# Patient Record
Sex: Male | Born: 1935 | Race: Black or African American | Hispanic: No | Marital: Single | Smoking: Never smoker
Health system: Southern US, Community
[De-identification: ages and names within clinical notes are randomized; demographics above are authoritative.]

## PROBLEM LIST (undated history)

## (undated) DIAGNOSIS — M1990 Unspecified osteoarthritis, unspecified site: Secondary | ICD-10-CM

## (undated) DIAGNOSIS — E079 Disorder of thyroid, unspecified: Secondary | ICD-10-CM

## (undated) DIAGNOSIS — I10 Essential (primary) hypertension: Secondary | ICD-10-CM

## (undated) DIAGNOSIS — H269 Unspecified cataract: Secondary | ICD-10-CM

## (undated) DIAGNOSIS — H409 Unspecified glaucoma: Secondary | ICD-10-CM

## (undated) HISTORY — DX: Essential (primary) hypertension: I10

## (undated) HISTORY — DX: Disorder of thyroid, unspecified: E07.9

## (undated) HISTORY — DX: Unspecified osteoarthritis, unspecified site: M19.90

## (undated) HISTORY — DX: Unspecified cataract: H26.9

## (undated) HISTORY — PX: EYE SURGERY: SHX253

## (undated) HISTORY — DX: Unspecified glaucoma: H40.9

## (undated) HISTORY — PX: CARDIAC VALVE REPLACEMENT: SHX585

---

## 1999-05-09 ENCOUNTER — Inpatient Hospital Stay: Admission: EM | Admit: 1999-05-09 | Discharge: 1999-05-10 | Payer: Self-pay | Admitting: *Deleted

## 2000-02-03 ENCOUNTER — Ambulatory Visit (HOSPITAL_COMMUNITY): Admission: RE | Admit: 2000-02-03 | Discharge: 2000-02-03 | Payer: Self-pay | Admitting: Cardiology

## 2000-02-05 ENCOUNTER — Ambulatory Visit (HOSPITAL_COMMUNITY): Admission: RE | Admit: 2000-02-05 | Discharge: 2000-02-05 | Payer: Self-pay | Admitting: Cardiology

## 2000-02-25 ENCOUNTER — Encounter: Payer: Self-pay | Admitting: Cardiothoracic Surgery

## 2000-03-01 ENCOUNTER — Encounter: Payer: Self-pay | Admitting: Cardiothoracic Surgery

## 2000-03-01 ENCOUNTER — Encounter (INDEPENDENT_AMBULATORY_CARE_PROVIDER_SITE_OTHER): Payer: Self-pay | Admitting: Specialist

## 2000-03-01 ENCOUNTER — Inpatient Hospital Stay (HOSPITAL_COMMUNITY): Admission: RE | Admit: 2000-03-01 | Discharge: 2000-03-07 | Payer: Self-pay | Admitting: Cardiothoracic Surgery

## 2000-03-02 ENCOUNTER — Encounter: Payer: Self-pay | Admitting: Cardiothoracic Surgery

## 2000-03-03 ENCOUNTER — Encounter: Payer: Self-pay | Admitting: Cardiothoracic Surgery

## 2000-03-04 ENCOUNTER — Encounter: Payer: Self-pay | Admitting: Cardiothoracic Surgery

## 2000-03-16 ENCOUNTER — Encounter: Admission: RE | Admit: 2000-03-16 | Discharge: 2000-03-16 | Payer: Self-pay | Admitting: Family Medicine

## 2000-08-01 ENCOUNTER — Encounter: Payer: Self-pay | Admitting: Internal Medicine

## 2000-08-01 ENCOUNTER — Encounter: Admission: RE | Admit: 2000-08-01 | Discharge: 2000-08-01 | Payer: Self-pay | Admitting: Internal Medicine

## 2000-11-23 ENCOUNTER — Emergency Department (HOSPITAL_COMMUNITY): Admission: EM | Admit: 2000-11-23 | Discharge: 2000-11-23 | Payer: Self-pay | Admitting: Emergency Medicine

## 2002-04-04 ENCOUNTER — Encounter: Payer: Self-pay | Admitting: Emergency Medicine

## 2002-04-04 ENCOUNTER — Emergency Department (HOSPITAL_COMMUNITY): Admission: EM | Admit: 2002-04-04 | Discharge: 2002-04-04 | Payer: Self-pay | Admitting: Emergency Medicine

## 2007-04-19 ENCOUNTER — Ambulatory Visit: Payer: Self-pay | Admitting: Vascular Surgery

## 2008-04-23 ENCOUNTER — Ambulatory Visit (HOSPITAL_COMMUNITY): Admission: RE | Admit: 2008-04-23 | Discharge: 2008-04-24 | Payer: Self-pay | Admitting: General Surgery

## 2010-09-29 NOTE — Op Note (Signed)
Lee Lyons, Lee Lyons                 ACCOUNT NO.:  [id number]   MEDICAL RECORD NO.:  [id number]          PATIENT TYPE:  AMB   LOCATION:  DAY                          FACILITY:  [facility]   PHYSICIAN:  Sharlet Salina T. Hoxworth, M.D.DATE OF BIRTH:  08-06-1935   DATE OF PROCEDURE:  04/23/2008  DATE OF DISCHARGE:                               OPERATIVE REPORT   PREOPERATIVE DIAGNOSIS:  Recurrent left inguinal hernia.   POSTOPERATIVE DIAGNOSIS:  Recurrent left inguinal hernia.   SURGICAL PROCEDURE:  Laparoscopic repair, recurrent left inguinal  hernia.   ANESTHESIA:  General.   BRIEF HISTORY:  Mr. Skelley is a 75 year old male with a remote history of  a left inguinal hernia repair.  He now presents with an increasingly  uncomfortable bulge in the left groin and has a recurrent hernia  confirmed on exam.  I have discussed options for treatment and after  discussion detailed elsewhere, we have elected to proceed with  laparoscopic repair of his hernia.  The nature of the procedure,  expected recovery, risks of open procedure, bladder or intestinal  injury, infection, bleeding, cardiorespiratory complications, and  recurrence were discussed and understood.  The patient is now brought to  the operating room for this procedure.   DESCRIPTION OF OPERATION:  The patient was brought to the operating  room, placed in the supine position on the operating room table, and  general endotracheal anesthesia was induced.  A Foley catheter was  placed.  The abdomen was widely sterilely prepped and draped.  PAS were  in place.  He received preoperative IV antibiotics.  The correct patient  and procedure were verified.  Local anesthesia was used infiltrate the  trocar sites.  A 1-cm incision was made at the umbilicus and dissection  carried down to the anterior fascia.  This was then sharply incised  transversely for 1 cm and the medial edge of the left rectus muscle  retracted laterally and the preperitoneal  space entered under direct  vision.  The tip of the balloon dissector was passed into this space and  then along the midline to the pubic symphysis.  Under laparoscopic  vision, the balloon was then inflated with good bilateral deployment.  It was left in place for several minutes for hemostasis then removed and  the balloon trocar inflated and CO2 pressure applied.  Laparoscopy  showed good dissection of the preperitoneal space.  The pubic symphysis  was identified and cleared, and the Cooper's ligament was cleared down  toward the iliac vessels which were identified and carefully protected.  The epigastric vessels were identified.  The peritoneum lateral to this  had been dissected well posteriorly, but it was further dissected and we  worked out laterally to the anterior-superior iliac spine.  The  peritoneum was dissected posteriorly out to the level of the umbilicus.  I then followed the peritoneal edge back medially and there was a good-  sized indirect sac.  The sac was carefully dissected away from cord  structures with blunt dissection, the cord structures isolated and  protected.  The sac was then reduced from  the internal ring and the  peritoneum stripped back completely posteriorly.  After thorough  dissection, a Bard left-sided large 3-D max mesh was introduced to the  preperitoneal space and oriented and positioned.  It was tacked  initially to the pubic symphysis midline.  The mesh was then deployed  well laterally and beginning along the superior edge laterally, being  able to feel the tip of the tacker through the abdominal wall.  The  superior edge of the mesh was tacked back, working medially, avoiding  the epigastric vessels, and then finally the medial edge of the mesh was  tacked and a couple of more tacks placed in Cooper's ligament.  The  hernia sac was brought deep to the mesh and then tacked again feeling  the tip of the tacker through the anterior abdominal wall  so that the  sac was held deep to the mesh.  A small opening had been created in the  peritoneum near the base of the sac and there was moderate  pneumoperitoneum.  The mesh appeared to be in excellent position.  There  was no bleeding.  No evidence of visceral injury.  CO2 was evacuated and  trocars were  removed.  A Veress needle in the right upper quadrant was then used to  fully evacuate the pneumoperitoneum.  Skin incisions were then closed  with interrupted Monocryl sutures and Dermabond.  Sponge, needle, and  instrument counts were correct.  The patient was taken to recovery in  good condition.      Lorne Skeens. Hoxworth, M.D.  Electronically Signed     BTH/MEDQ  D:  04/23/2008  T:  04/23/2008  Job:  [id number]

## 2010-09-29 NOTE — Procedures (Signed)
CAROTID DUPLEX EXAM   INDICATION:  Left eye visual disturbance.   HISTORY:  Diabetes:  No.  Cardiac:  CABG in 2000.  Hypertension:  Yes.  Smoking:  No. Quit 25 years ago.  Previous Surgery:  No.  CV History:  The patient complained of feeling like there was a covering  over his left eye.  This has since resolved.  Amaurosis Fugax Yes , Paresthesias No, Hemiparesis No.                                       RIGHT             LEFT  Brachial systolic pressure:         120               120  Brachial Doppler waveforms:         Biphasic          Biphasic  Vertebral direction of flow:        Antegrade         Antegrade  DUPLEX VELOCITIES (cm/sec)  CCA peak systolic                   84                64  ECA peak systolic                   54                45  ICA peak systolic                   80                48  ICA end diastolic                   29                19  PLAQUE MORPHOLOGY:                  Mixed             None  PLAQUE AMOUNT:                      Mild              None  PLAQUE LOCATION:                    Proximal ICA      None   IMPRESSION:  1. 20-39% right ICA stenosis.  2. No left ICA stenosis.  3. A preliminary copy was faxed to Dr. Ronnald Nian office.   ___________________________________________  Larina Earthly, M.D.   DP/MEDQ  D:  04/19/2007  T:  04/20/2007  Job:  [id number]

## 2010-10-02 NOTE — Op Note (Signed)
[place]. [facility]  Patient:    CATALINO, PLASCENCIA                        MRN: [id number] Proc. Date: 02/29/00 Adm. Date:  [id number] Attending:  Waldo Laine CC:         Colleen Can. Deborah Chalk, M.D.                           Operative Report  PREOPERATIVE DIAGNOSIS:  Severe aortic insufficiency.  POSTOPERATIVE DIAGNOSIS:  Severe aortic insufficiency.  SURGICAL PROCEDURE:  Aortic valve replacement with a #23 pericardial tissue valve.  SURGEON:  Gwenith Daily. Tyrone Sage, M.D.  FIRST ASSISTANT:  Nicholes Calamity, P.A.  BRIEF HISTORY:  The patient is a 75 year old male who has severe aortic insufficiency and developed increasing symptoms of congestive heart failure. He was originally seen in November of 2000, and at that time with severe aortic insufficiency and symptoms of congestive heart failure.  Aortic valve replacement was recommended.  The patient decided he would seek care at the [facility] which he did and evidently was told he did not need surgery.  He was lost to follow up when he represented recently with increasing symptoms of heart failure and was seen by Dr. Loleta Books for further evaluation.  Echocardiogram and cardiac catheterization confirmed severe aortic insufficiency with severe left ventricular dysfunction, though the patient seemed to have a better functional class in the degree of LV dysfunction it was present on ventriculogram and echocardiogram.  This aortic valve replacement was recommended to the patient and in counseling it was decided that long term Coumadin therapy would be best avoided.  The patient agreed and signed informed consent.  DESCRIPTION OF PROCEDURE:  With Theone Murdoch and arterial line monitor placed the patient underwent general endotracheal anesthesia.  He was noted to have a placement of a Swan Ganz catheter and to have significant pulmonary hypertension and this was also noted on his cardiac catheterization.   A transesophageal echocardiogram probe was placed and confirmed severe aortic insufficiency.  The skin of the chest and legs was prepped with Betadine and draped in the usual sterile manner.  A median sternotomy was performed. Pericardium was opened.  The patient had marked enlargement of the right side of the heart and right atrium.  He was in a sinus rhythm.  He was systemically heparinized.  The ascending aorta and the right atrium were cannulated.  In the aortic root then cardioplegia needle was introduced into the ascending aorta.  The patient was placed on cardiopulmonary bypass 2.4 liters per minute per m sq.  The right superior pulmonary vein ______ was placed.  The  The patients body temperature was cooled to 28 degrees.  The aortic crossclamp was applied and 500 cc of transverse aortotomy was created. Cardioplegia was administered directly into the coronary ostium.  With diastolic arrest of the heart, myocardial septal temperature was monitored throughout the crossclamp.  On examination of the valve the patient had a tricuspid valve with significantly rolled edges of the valve and prolapsing of the right coronary cusp.  The valve was excised.  A annulus was of a size to admit a 23 pericardial tissue valve which was selected and secured in place with interrupted #2 Tycron pledget of sutures.  With the valve well seated there was no impingement of the right or left coronary ostium.  The aorta was closed with  a horizontal mattress 3-0 Prolene suture.  The head was put in a down position.  The heart was allowed to passively fill and deair.  The aortic crossclamp was removed and the total crossclamp time was 66 minutes.  The patients body temperature rewarmed to 37 degrees.  He required electrical defibrillation and returned to a sinus rhythm and was transiently atrially paced to increase the rate.  A 16 gauge needle was introduced into the left ventricular apex to further deair the  heart.  With the patients body temperature rewarmed to 37 degrees she was started on melanin and dopamine infusions and was ventilated and weaned from cardiopulmonary bypass and remaining hemodynamically stable, and was decannulated in the usual fashion.  Protamine and sulfate was administered. With the operative field hemostatic, two atrial and two ventricular pacing wires were applied.  Graft markers applied.  A left pleural tube and two mediastinal tubes left in place.   The sternum was closed with #6 stainless steel wire.  Fascia was closed with interrupted 0 Vicryl, running 3-0 Vicryl and subcutaneous tissue, 4-0 subcuticular stitch in the skin edges. Dry dressings were applied.  Sponge and needle counts were reported as correct at the completion of the procedure.  The patient tolerated the procedure without obvious complication and was transferred to the surgical intensive care unit for further postoperative care. DD:  03/02/00 TD:  03/02/00 Job: [id number]

## 2010-10-02 NOTE — Cardiovascular Report (Signed)
[place]  Patient:    Lee Lyons, Lee Lyons                        MRN: [id number] Proc. Date: 02/05/00 Adm. Date:  [id number] Attending:  Eleanora Neighbor CC:         Antony Madura, M.D.   Cardiac Catheterization  INDICATIONS:  Lee Lyons has known severe aortic insufficiency and presented with pulmonary edema in December of 2000.  He elected to proceed to the [facility] and then proceeded with the appropriate medical care.  He presents now with increasing dyspnea and edema.  He has improved on diuretics.  PROCEDURE:  Right and left heart catheterization with selective coronary angiography, left ventricular angiography, and aortic root angiography.  TYPE AND SITE OF ENTRY:  Right femoral artery, percutaneous right femoral vein, attempted Perclose which was unsuccessful.  MEDICATIONS GIVEN PRIOR TO THE PROCEDURE:  Valium 10 mg p.o.  MEDICATIONS GIVEN DURING THE PROCEDURE:  Versed 2 mg IV.  CATHETERS:  A 6 French 4 curved Judkins right and left coronary catheters, 6 French pigtail ventriculographic catheter, 7 French Swan-Ganz thermodilution catheter.  CONTRAST MATERIAL:  Omnipaque.  COMMENTS:  The patient tolerated the procedure well.  HEMODYNAMIC DATA:  The right atrial pressure A wave of 15, V wave of 15, mean of 13, RV was 59/21.  Pulmonary capillary wedge pressure showed A wave 28, V wave 28, mean of 25.  Pulmonary artery pressure is 56/31.  Cardiac output was 1.6, cardiac index was 1.0 by thermodilution by Fick.  Karo was 2.8 with an cardiac index of 1.8.  The LV pressure is 115/25, the aortic pressure was 115/66.  There was no aortic valve gradient noted.  The aortic valve area was calculated and was basically within normal limits.  ANGIOGRAPHIC DATA:  LEFT VENTRICULOGRAPHY:  The left ventricular angiogram was performed in the RAO position.  There as global hypokinesis and the global ejection fraction was 30%.  There was 1-2+  mitral regurgitation noted.  There was no intracavitary filling defects or intracardiac calcification.  AORTIC ROOT:  The aortic root was performed using 40 cc of contrast at 20 cc/sec.  There was 4+ aortic insufficiency with the left ventricle becoming more opacified than aortic root as the cycles continued.  CORONARY ARTERIES: 1. Left main coronary artery:  Normal. 2. Left circumflex:  The left circumflex has a 90% small obtuse marginal that    continues on the posterolateral branch.  The first obtuse marginal has    irregularities but is otherwise without significant focal disease. 3. Left anterior descending:  The left anterior descending has irregularities.    There is a large proximal diagonal.  It is free of disease otherwise. 4. Right coronary artery:  The right coronary artery is a large dominant    vessel.  It has irregularities but no significant focal disease.  OVERALL IMPRESSION: 1. Left ventricular dysfunction. 2. Severe aortic insufficiency. 3. Mild mitral regurgitation. 4. Single-vessel coronary disease.  DISCUSSION:  Unfortunately it would appear that Lee Lyons is beyond the point that we can proceed on with repair of his aortic insufficiency.  We will ask the Cardiovascular Surgeons to evaluate him for possible surgery, but it would be my impression that he unfortunately is not a good candidate at this point. D:  02/05/00 TD:  02/05/00 Job: [id number]

## 2010-10-02 NOTE — Discharge Summary (Signed)
[place]. [facility]  Patient:    Lee Lyons, Lee Lyons                        MRN: [id number] Adm. Date:  [id number] Disc. Date: [id number] Attending:  Waldo Laine Dictator:   Lissa Hoard, P.A.-C.                           Discharge Summary  DATE OF BIRTH:  26-Aug-1935  ADMITTING DIAGNOSES:  Aortic insufficiency.  DISCHARGE DIAGNOSES:  Status post aortic valve repair.  ADMITTING HISTORY AND PHYSICAL:  This 75 year old African-American male was referred to Dr. Tyrone Sage for evaluation of aortic stenosis.  The patient had recently presented to the hospital with pulmonary edema and significant shortness of breath and nonspecific anginal symptoms.  A 2-D echocardiogram done at that time showed left ventricular hypertrophy with decreased left ventricular function.  There was also severe aortic insufficiency and severe mitral regurgitation noted.  Because of these symptoms, Dr. Tyrone Sage recommended aortic valve replacement.  PHYSICAL EXAMINATION:  VITAL SIGNS:  Blood pressure 120/80, pulse 88 and regular, respirations 20.  HEENT:  Within normal limits with the exception of bilateral glaucoma.  NECK:  Supple without JVD, carotid bruit, thyromegaly, or lymphadenopathy.  LUNGS:  Clear to auscultation bilaterally.  CARDIOVASCULAR:  Regular rate and rhythm with a 2/6 systolic murmur extending to the axilla.  ABDOMEN:  Soft, nontender, positive bowel sounds in all four quadrants.  EXTREMITIES:  Revealed no cyanosis, clubbing; however, there was trace edema bilaterally.  NEUROLOGIC:  Grossly intact.  HOSPITAL COURSE:  March 01, 2000, the patient underwent an aortic valve replacement with a 23 mm pericardial valve.  The procedure was performed by Dr. Tyrone Sage under general endotracheal anesthesia and cardiopulmonary bypass. No complications were noted during the procedure; however, the patient was started on milrinone intraoperatively, and the  patient was transferred to SICU in stable condition.  Postoperatively he was found to a cardiac index of 1.5 and hematocrit 46.  He was extubated later on the operative day.  On postoperative day one, the patient was found to be in normal sinus rhythm which he remained throughout the remainder of his hospital stay.  He was weaned off milrinone and found to have a hematocrit of 37.8.  His mediastinum tube was discontinued on this date.  His creatinine was 1.2 at this time which remained stable throughout the remainder of his hospital stay.  ______ 3.7 which was repleted.  At this time, he had a cardiac index of 2.2 and 2.3. Postoperative day #2, the patient was transferred out of the SICU, had a hematocrit of 31.6 and remained in normal sinus rhythm.  He was started on Coumadin and later found to have an INR of 1.7.  By postoperative day #3, the patients INR had fallen to 1.3.  Hematocrit had fallen slightly to 27.2.  He began ambulation which he tolerated well.  He remained in normal sinus rhythm on postoperative day #4, and he was continued on Coumadin.  Postoperative day #5, he again remained stable; however, he had a short run of supraventricular tachycardia with no sequelae.  His INR was 1.5 at this time.  By postoperative day #6, the patients INR had risen to 1.9., and he was stable tolerating p.o. intake and ambulating well.  Discharge planning was made for postoperative day #7, March 08, 2000.  DISCHARGE MEDICATIONS: 1. Darvocet-N 100, 1-2 tablets  every 4-6 hours as needed for pain. 2. Coumadin 1 tablet daily or as directed by INR. 3. Lopressor 50 mg 1/2 tablet every 12 hours. 4. Imdur 30 mg 1 tablet daily. 5. The patient was also instructed to resume his home medications.  DISCHARGE ACTIVITY:  The patient is told to avoid driving or lifting objects over 10 lbs. and other strenuous activity.  DISCHARGE DIET:  Low fat, low salt.  DISCHARGE WOUND CARE:  The patient was told  that he may shower and clean his incisions with mild soap and water.  DISCHARGE DISPOSITION:  To home.  DISCHARGE FOLLOW-UP:  The patient was instructed to call his cardiologist to schedule an appointment for two weeks.  He was also instructed to have his INR checked in two days which will further dictate his Coumadin therapy.  The patient was also instructed to bring x-ray taken by his cardiologist to Dr. Macky Lower office on follow-up, and he is told that Dr. Macky Lower office will call him for an appointment in three weeks. DD:  04/04/00 TD:  04/05/00 Job: [id number]

## 2011-02-19 LAB — CBC
MCV: 98.1 fL (ref 78.0–100.0)
Platelets: 142 10*3/uL — ABNORMAL LOW (ref 150–400)
RBC: 4.02 MIL/uL — ABNORMAL LOW (ref 4.22–5.81)
WBC: 3.5 10*3/uL — ABNORMAL LOW (ref 4.0–10.5)

## 2011-02-19 LAB — COMPREHENSIVE METABOLIC PANEL
ALT: 31 U/L (ref 0–53)
AST: 31 U/L (ref 0–37)
Albumin: 3.8 g/dL (ref 3.5–5.2)
Calcium: 9.8 mg/dL (ref 8.4–10.5)
Chloride: 105 mEq/L (ref 96–112)
Creatinine, Ser: 1.22 mg/dL (ref 0.4–1.5)
GFR calc Af Amer: >60 mL/min (ref >60)
Sodium: 139 mEq/L (ref 135–145)

## 2011-02-19 LAB — URINALYSIS, ROUTINE W REFLEX MICROSCOPIC
Glucose, UA: NEGATIVE mg/dL
Hgb urine dipstick: NEGATIVE
Protein, ur: NEGATIVE mg/dL
Specific Gravity, Urine: 1.015 (ref 1.005–1.030)
pH: 5.5 (ref 5.0–8.0)

## 2011-02-19 LAB — DIFFERENTIAL
Eosinophils Absolute: 0.1 10*3/uL (ref 0.0–0.7)
Eosinophils Relative: 2 % (ref 0–5)
Lymphocytes Relative: 30 % (ref 12–46)
Lymphs Abs: 1 10*3/uL (ref 0.7–4.0)
Monocytes Absolute: 0.4 10*3/uL (ref 0.1–1.0)

## 2011-02-19 LAB — URINE MICROSCOPIC-ADD ON

## 2013-08-10 ENCOUNTER — Ambulatory Visit (INDEPENDENT_AMBULATORY_CARE_PROVIDER_SITE_OTHER): Payer: Medicare Other | Admitting: Emergency Medicine

## 2013-08-10 ENCOUNTER — Ambulatory Visit: Payer: Medicare Other

## 2013-08-10 VITALS — BP 110/70 | HR 56 | Temp 97.8°F | Resp 16 | Ht 63.5 in | Wt 118.0 lb

## 2013-08-10 DIAGNOSIS — R3589 Other polyuria: Secondary | ICD-10-CM

## 2013-08-10 DIAGNOSIS — S20229A Contusion of unspecified back wall of thorax, initial encounter: Secondary | ICD-10-CM

## 2013-08-10 DIAGNOSIS — R358 Other polyuria: Secondary | ICD-10-CM

## 2013-08-10 DIAGNOSIS — S9000XA Contusion of unspecified ankle, initial encounter: Secondary | ICD-10-CM

## 2013-08-10 DIAGNOSIS — S60229A Contusion of unspecified hand, initial encounter: Secondary | ICD-10-CM

## 2013-08-10 DIAGNOSIS — M549 Dorsalgia, unspecified: Secondary | ICD-10-CM

## 2013-08-10 DIAGNOSIS — M79609 Pain in unspecified limb: Secondary | ICD-10-CM

## 2013-08-10 DIAGNOSIS — S300XXA Contusion of lower back and pelvis, initial encounter: Secondary | ICD-10-CM

## 2013-08-10 LAB — GLUCOSE, POCT (MANUAL RESULT ENTRY): POC GLUCOSE: 145 mg/dL — AB (ref 70–99)

## 2013-08-10 MED ORDER — MELOXICAM 15 MG PO TABS: 15.0000 mg | ORAL_TABLET | Freq: Every day | ORAL | Status: AC

## 2013-08-10 NOTE — Patient Instructions (Signed)
Type 2 Diabetes Mellitus, Adult Type 2 diabetes mellitus, often simply referred to as type 2 diabetes, is a long-lasting (chronic) disease. In type 2 diabetes, the pancreas does not make enough insulin (a hormone), the cells are less responsive to the insulin that is made (insulin resistance), or both. Normally, insulin moves sugars from food into the tissue cells. The tissue cells use the sugars for energy. The lack of insulin or the lack of normal response to insulin causes excess sugars to build up in the blood instead of going into the tissue cells. As a result, high blood sugar (hyperglycemia) develops. The effect of high sugar (glucose) levels can cause many complications. Type 2 diabetes was also previously called adult-onset diabetes but it can occur at any age.  RISK FACTORS  A person is predisposed to developing type 2 diabetes if someone in the family has the disease and also has one or more of the following primary risk factors:  Overweight.  An inactive lifestyle.  A history of consistently eating high-calorie foods. Maintaining a normal weight and regular physical activity can reduce the chance of developing type 2 diabetes. SYMPTOMS  A person with type 2 diabetes may not show symptoms initially. The symptoms of type 2 diabetes appear slowly. The symptoms include:  Increased thirst (polydipsia).  Increased urination (polyuria).  Increased urination during the night (nocturia).  Weight loss. This weight loss may be rapid.  Frequent, recurring infections.  Tiredness (fatigue).  Weakness.  Vision changes, such as blurred vision.  Fruity smell to your breath.  Abdominal pain.  Nausea or vomiting.  Cuts or bruises which are slow to heal.  Tingling or numbness in the hands or feet. DIAGNOSIS Type 2 diabetes is frequently not diagnosed until complications of diabetes are present. Type 2 diabetes is diagnosed when symptoms or complications are present and when blood  glucose levels are increased. Your blood glucose level may be checked by one or more of the following blood tests:  A fasting blood glucose test. You will not be allowed to eat for at least 8 hours before a blood sample is taken.  A random blood glucose test. Your blood glucose is checked at any time of the day regardless of when you ate.  A hemoglobin A1c blood glucose test. A hemoglobin A1c test provides information about blood glucose control over the previous 3 months.  An oral glucose tolerance test (OGTT). Your blood glucose is measured after you have not eaten (fasted) for 2 hours and then after you drink a glucose-containing beverage. TREATMENT   You may need to take insulin or diabetes medicine daily to keep blood glucose levels in the desired range.  You will need to match insulin dosing with exercise and healthy food choices. The treatment goal is to maintain the before meal blood sugar (preprandial glucose) level at 70 130 mg/dL. HOME CARE INSTRUCTIONS   Have your hemoglobin A1c level checked twice a year.  Perform daily blood glucose monitoring as directed by your caregiver.  Monitor urine ketones when you are ill and as directed by your caregiver.  Take your diabetes medicine or insulin as directed by your caregiver to maintain your blood glucose levels in the desired range.  Never run out of diabetes medicine or insulin. It is needed every day.  Adjust insulin based on your intake of carbohydrates. Carbohydrates can raise blood glucose levels but need to be included in your diet. Carbohydrates provide vitamins, minerals, and fiber which are an essential part of   a healthy diet. Carbohydrates are found in fruits, vegetables, whole grains, dairy products, legumes, and foods containing added sugars.    Eat healthy foods. Alternate 3 meals with 3 snacks.  Lose weight if overweight.  Carry a medical alert card or wear your medical alert jewelry.  Carry a 15 gram  carbohydrate snack with you at all times to treat low blood glucose (hypoglycemia). Some examples of 15 gram carbohydrate snacks include:  Glucose tablets, 3 or 4   Glucose gel, 15 gram tube  Raisins, 2 tablespoons (24 grams)  Jelly beans, 6  Animal crackers, 8  Regular pop, 4 ounces (120 mL)  Gummy treats, 9  Recognize hypoglycemia. Hypoglycemia occurs with blood glucose levels of 70 mg/dL and below. The risk for hypoglycemia increases when fasting or skipping meals, during or after intense exercise, and during sleep. Hypoglycemia symptoms can include:  Tremors or shakes.  Decreased ability to concentrate.  Sweating.  Increased heart rate.  Headache.  Dry mouth.  Hunger.  Irritability.  Anxiety.  Restless sleep.  Altered speech or coordination.  Confusion.  Treat hypoglycemia promptly. If you are alert and able to safely swallow, follow the 15:15 rule:  Take 15 20 grams of rapid-acting glucose or carbohydrate. Rapid-acting options include glucose gel, glucose tablets, or 4 ounces (120 mL) of fruit juice, regular soda, or low fat milk.  Check your blood glucose level 15 minutes after taking the glucose.  Take 15 20 grams more of glucose if the repeat blood glucose level is still 70 mg/dL or below.  Eat a meal or snack within 1 hour once blood glucose levels return to normal.    Be alert to polyuria and polydipsia which are early signs of hyperglycemia. An early awareness of hyperglycemia allows for prompt treatment. Treat hyperglycemia as directed by your caregiver.  Engage in at least 150 minutes of moderate-intensity physical activity a week, spread over at least 3 days of the week or as directed by your caregiver. In addition, you should engage in resistance exercise at least 2 times a week or as directed by your caregiver.  Adjust your medicine and food intake as needed if you start a new exercise or sport.  Follow your sick day plan at any time you  are unable to eat or drink as usual.  Avoid tobacco use.  Limit alcohol intake to no more than 1 drink per day for nonpregnant women and 2 drinks per day for men. You should drink alcohol only when you are also eating food. Talk with your caregiver whether alcohol is safe for you. Tell your caregiver if you drink alcohol several times a week.  Follow up with your caregiver regularly.  Schedule an eye exam soon after the diagnosis of type 2 diabetes and then annually.  Perform daily skin and foot care. Examine your skin and feet daily for cuts, bruises, redness, nail problems, bleeding, blisters, or sores. A foot exam by a caregiver should be done annually.  Brush your teeth and gums at least twice a day and floss at least once a day. Follow up with your dentist regularly.  Share your diabetes management plan with your workplace or school.  Stay up-to-date with immunizations.  Learn to manage stress.  Obtain ongoing diabetes education and support as needed.  Participate in, or seek rehabilitation as needed to maintain or improve independence and quality of life. Request a physical or occupational therapy referral if you are having foot or hand numbness or difficulties with grooming,   dressing, eating, or physical activity. SEEK MEDICAL CARE IF:   You are unable to eat food or drink fluids for more than 6 hours.  You have nausea and vomiting for more than 6 hours.  Your blood glucose level is over 240 mg/dL.  There is a change in mental status.  You develop an additional serious illness.  You have diarrhea for more than 6 hours.  You have been sick or have had a fever for a couple of days and are not getting better.  You have pain during any physical activity.  SEEK IMMEDIATE MEDICAL CARE IF:  You have difficulty breathing.  You have moderate to large ketone levels. MAKE SURE YOU:  Understand these instructions.  Will watch your condition.  Will get help right away if  you are not doing well or get worse. Document Released: 05/03/2005 Document Revised: 01/26/2012 Document Reviewed: 11/30/2011 [employer] Patient Information 2014 [employer].  

## 2013-08-10 NOTE — Progress Notes (Signed)
[facility] [address], [place] [address] [phone]  Date:  08/10/2013   Name:  Lee Lyons   DOB:  [birth year]   MRN:  [id number]  PCP:  No primary provider on file.    Chief Complaint: Fall   History of Present Illness:  Lee Lyons is a 78 y.o. very pleasant male patient who presents with the following:  Slipped on a pepper at a grocery store yesterday and landed on his butt.  Turned his right ankle.  Broke his fall with his hands.  Has pain.  Denies radiation of pain or neuro symptoms.  No nausea or vomiting.  No improvement with over the counter medications or other home remedies. . Denies other complaint or health concern today.   There are no active problems to display for this patient.   Past Medical History  Diagnosis Date  . Arthritis   . Cataract   . Hypertension   . Glaucoma   . Thyroid disease     Past Surgical History  Procedure Laterality Date  . Eye surgery    . Cardiac valve replacement      History  Substance Use Topics  . Smoking status: Never Smoker   . Smokeless tobacco: Not on file  . Alcohol Use: Not on file    History reviewed. No pertinent family history.  No Known Allergies  Medication list has been reviewed and updated.  No current outpatient prescriptions on file prior to visit.   No current facility-administered medications on file prior to visit.    Review of Systems:  As per HPI, otherwise negative.    Physical Examination: Filed Vitals:   08/10/13 1121  BP: 110/70  Pulse: 56  Temp: 97.8 F (36.6 C)  Resp: 16   Filed Vitals:   08/10/13 1121  Height: 5' 3.5" (1.613 m)  Weight: 118 lb (53.524 kg)   Body mass index is 20.57 kg/(m^2). Ideal Body Weight: Weight in (lb) to have BMI = 25: 143.1  GEN: WDWN, NAD, Non-toxic, A & O x 3  Strong ketotic odor HEENT: Atraumatic, Normocephalic. Neck supple. No masses, No LAD. Ears and Nose: No external deformity. CV: RRR, No M/G/R. No JVD.  No thrill. No extra heart sounds. PULM: CTA B, no wheezes, crackles, rhonchi. No retractions. No resp. distress. No accessory muscle use. ABD: S, NT, ND, +BS. No rebound. No HSM. EXTR: No c/c/e NEURO Normal gait.  PSYCH: Normally interactive. Conversant. Not depressed or anxious appearing.  Calm demeanor.    Assessment and Plan: Low back pain Contusion fingers Contusion ankle Concern about NIDDM   Signed,  Phillips OdorJeffery Olaoluwa Grieder, MD   Results for orders placed in visit on 08/10/13  GLUCOSE, POCT (MANUAL RESULT ENTRY)      Result Value Ref Range   POC Glucose 145 (*) 70 - 99 mg/dl    [facility] reading (PRIMARY) by  Dr. Dareen PianoAnderson.  L & R index fingers negative; right ankle negative; LS Spine no osseous injury.

## 2015-09-12 IMAGING — CR DG ANKLE COMPLETE 3+V*R*
3 series · 3 of 3 positions shown · non-contrast
Comparison: None.

CLINICAL DATA: Arthritis, polyuria

EXAM:
RIGHT ANKLE - COMPLETE 3+ VIEW

[AP]
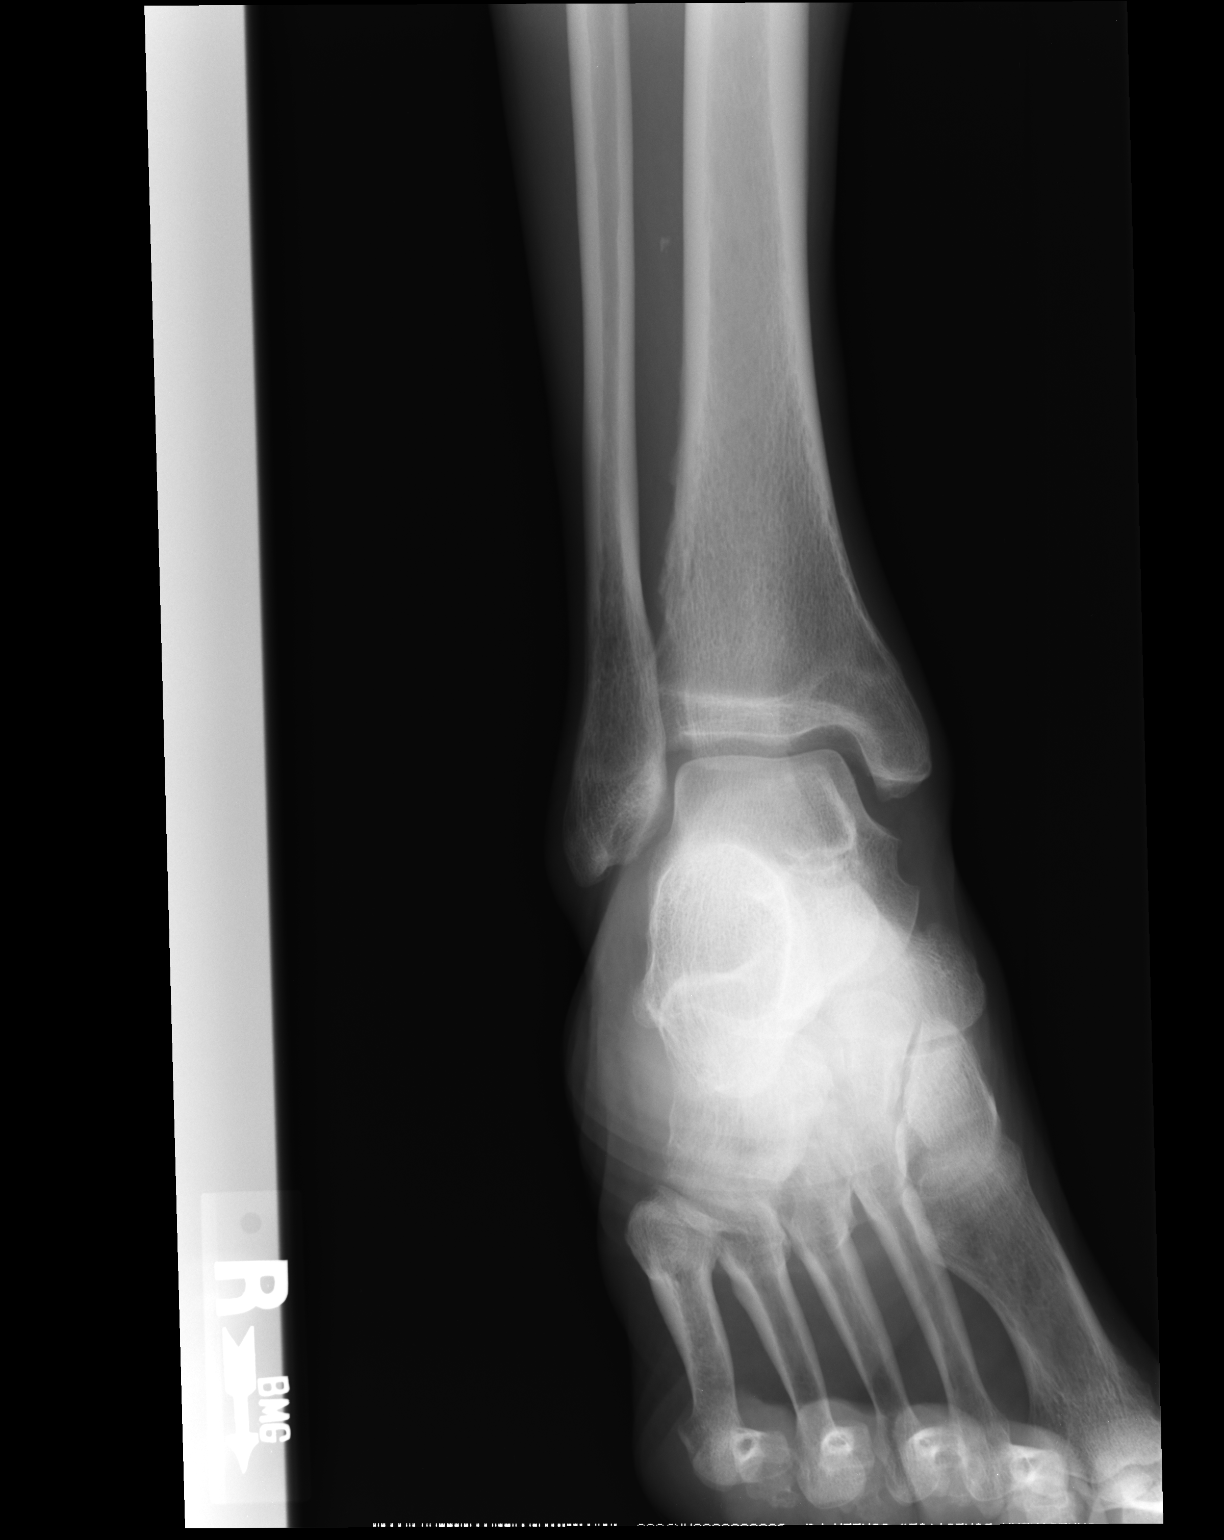

[ap obl int rot]
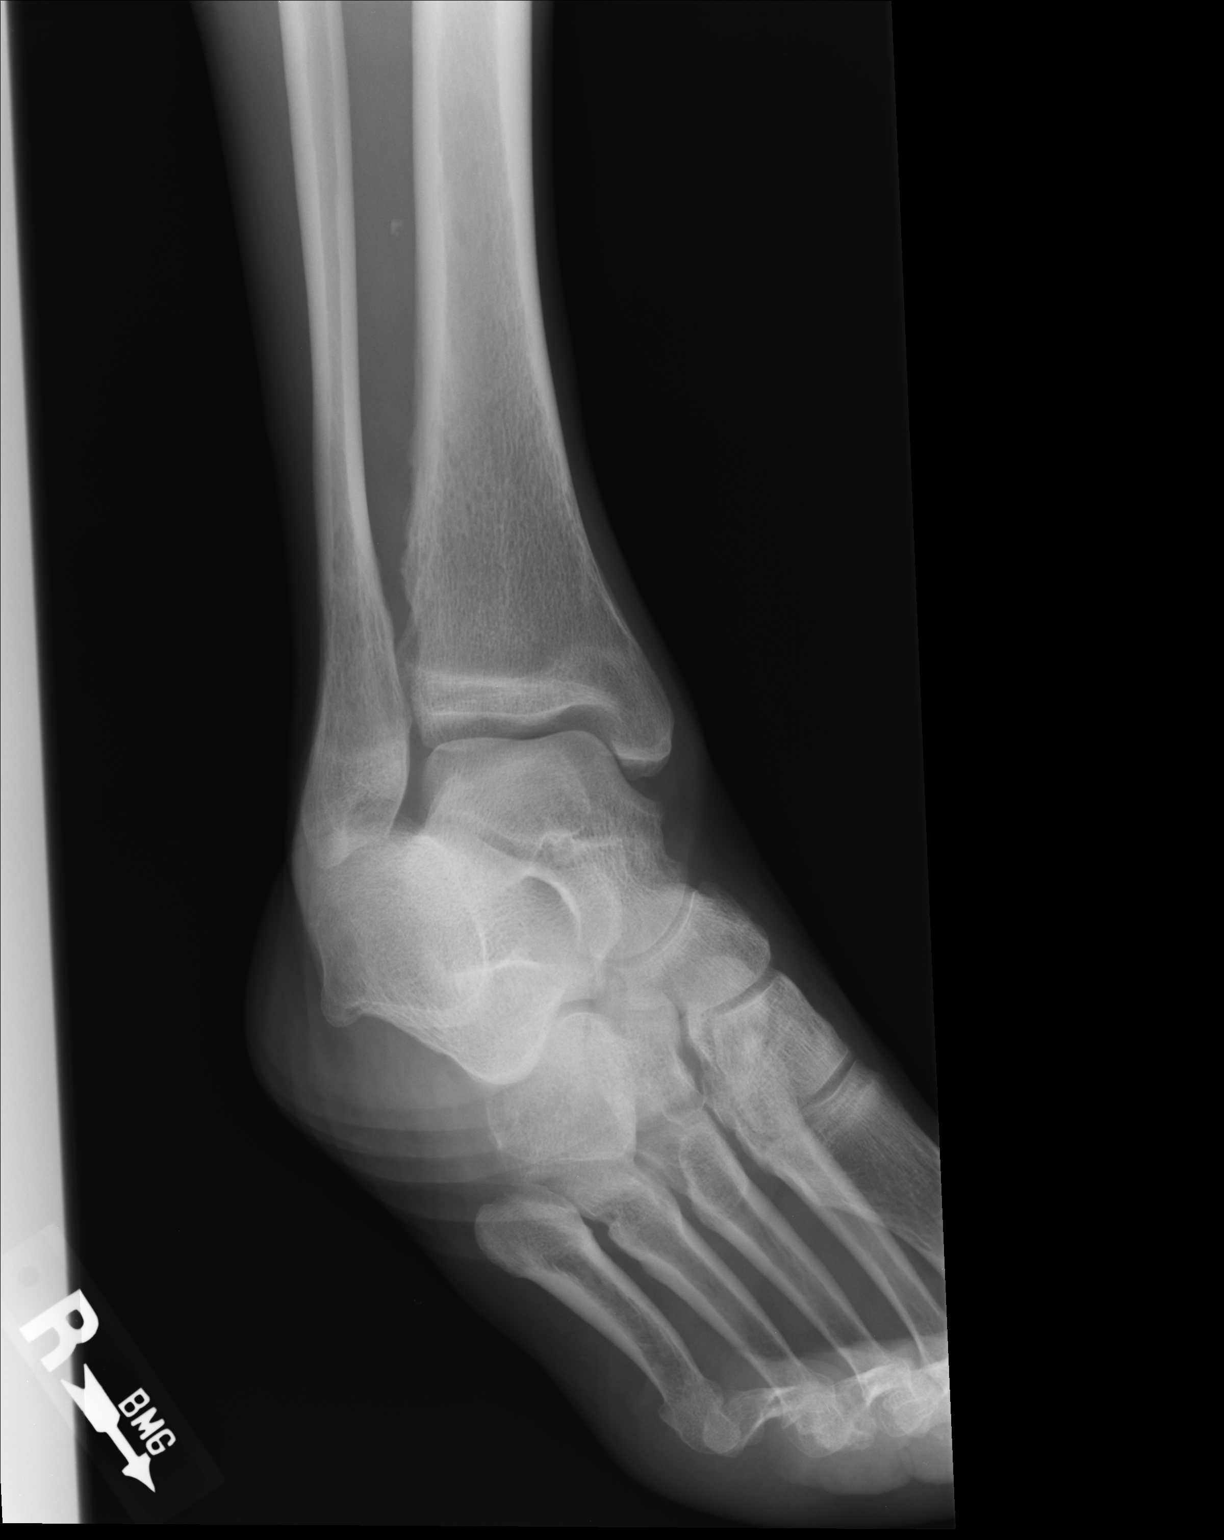

[lateral]
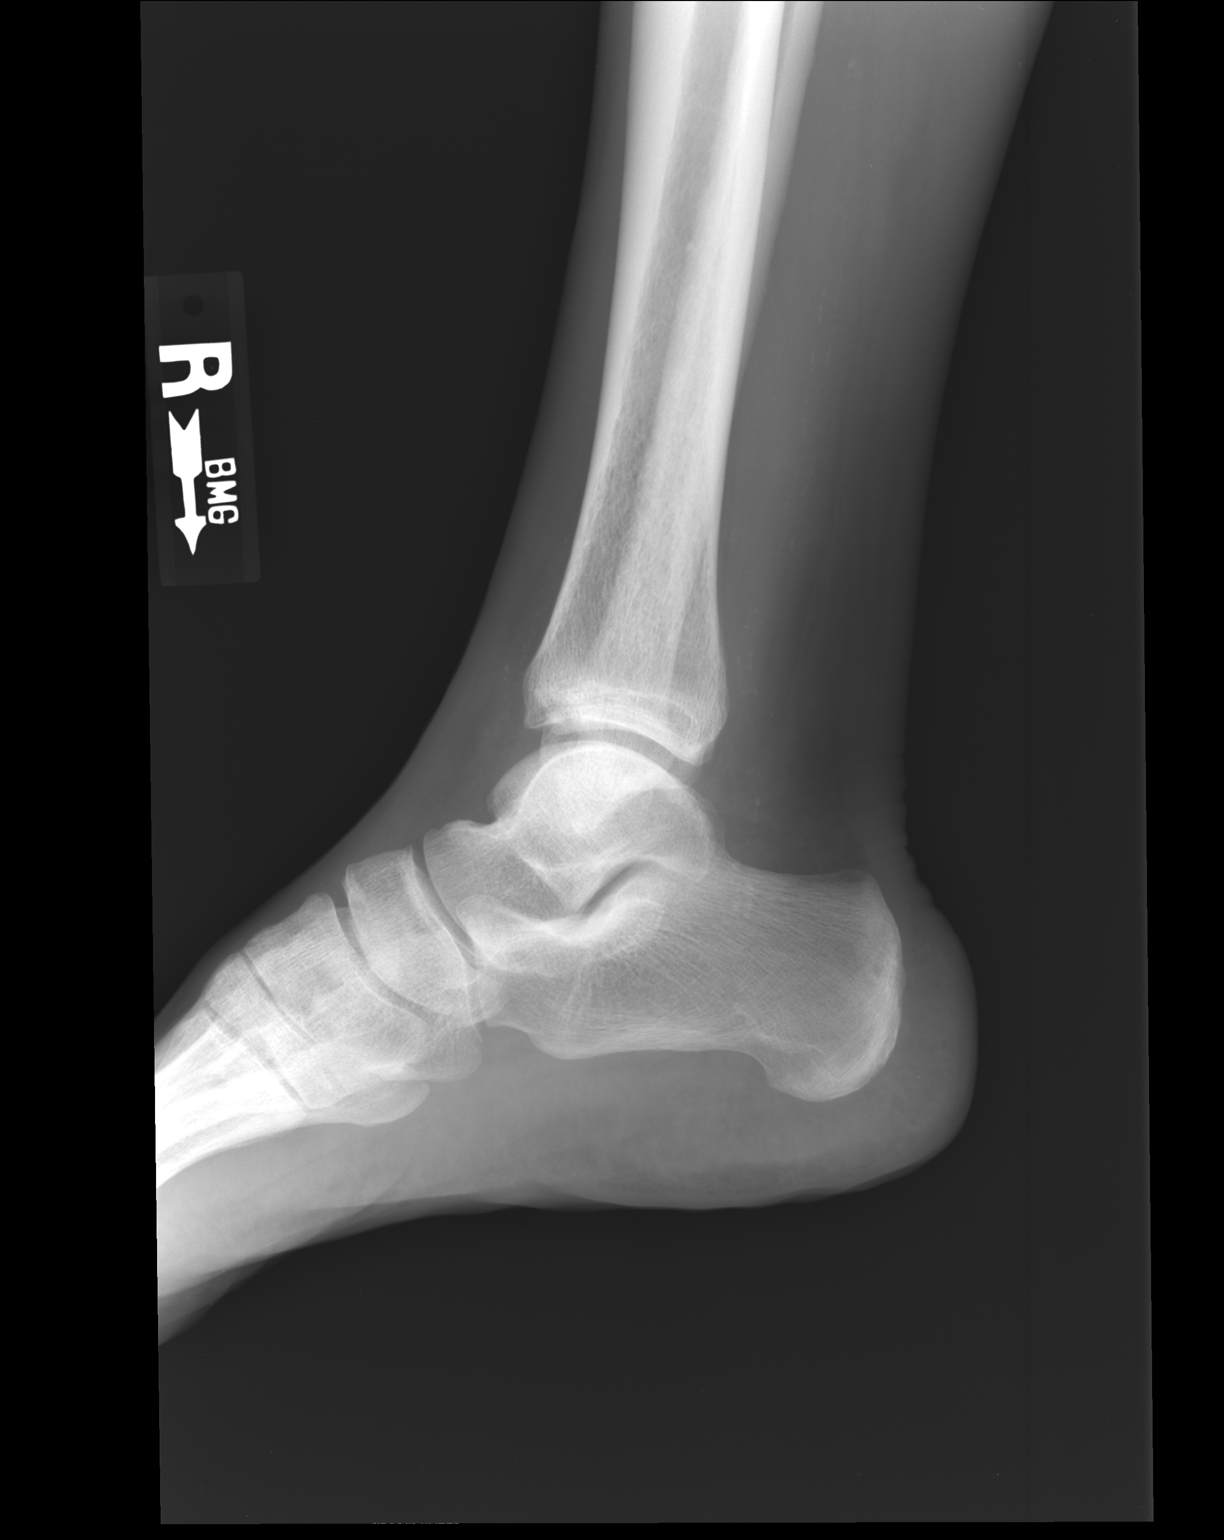

[3 of 3 positions shown; findings below may reference images not displayed]

FINDINGS: Three views of right ankle submitted. No acute fracture or
subluxation. Ankle mortise is preserved.
IMPRESSION: Negative.

## 2015-09-12 IMAGING — CR DG FINGER INDEX 2+V*L*
3 series · 3 of 3 positions shown · non-contrast
Comparison: None.

CLINICAL DATA: Polyuria, arthritis

EXAM:
LEFT INDEX FINGER 2+V

[PA]
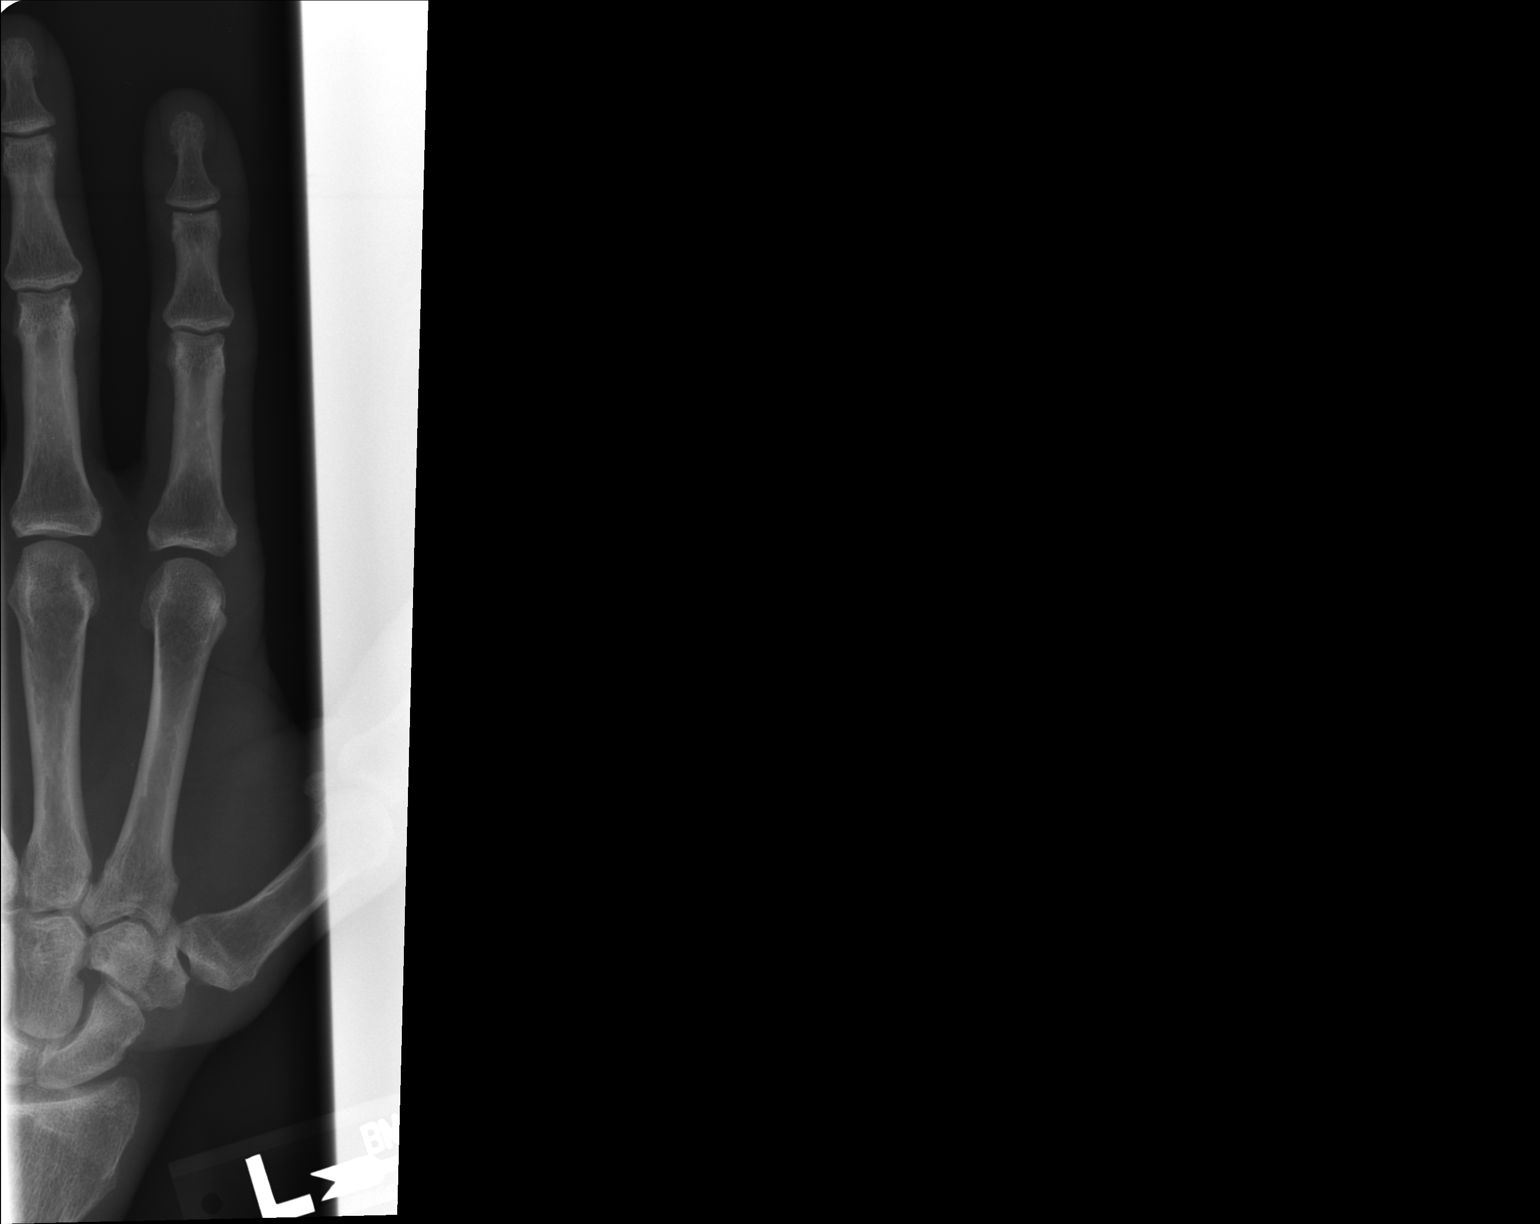

[pa obl]
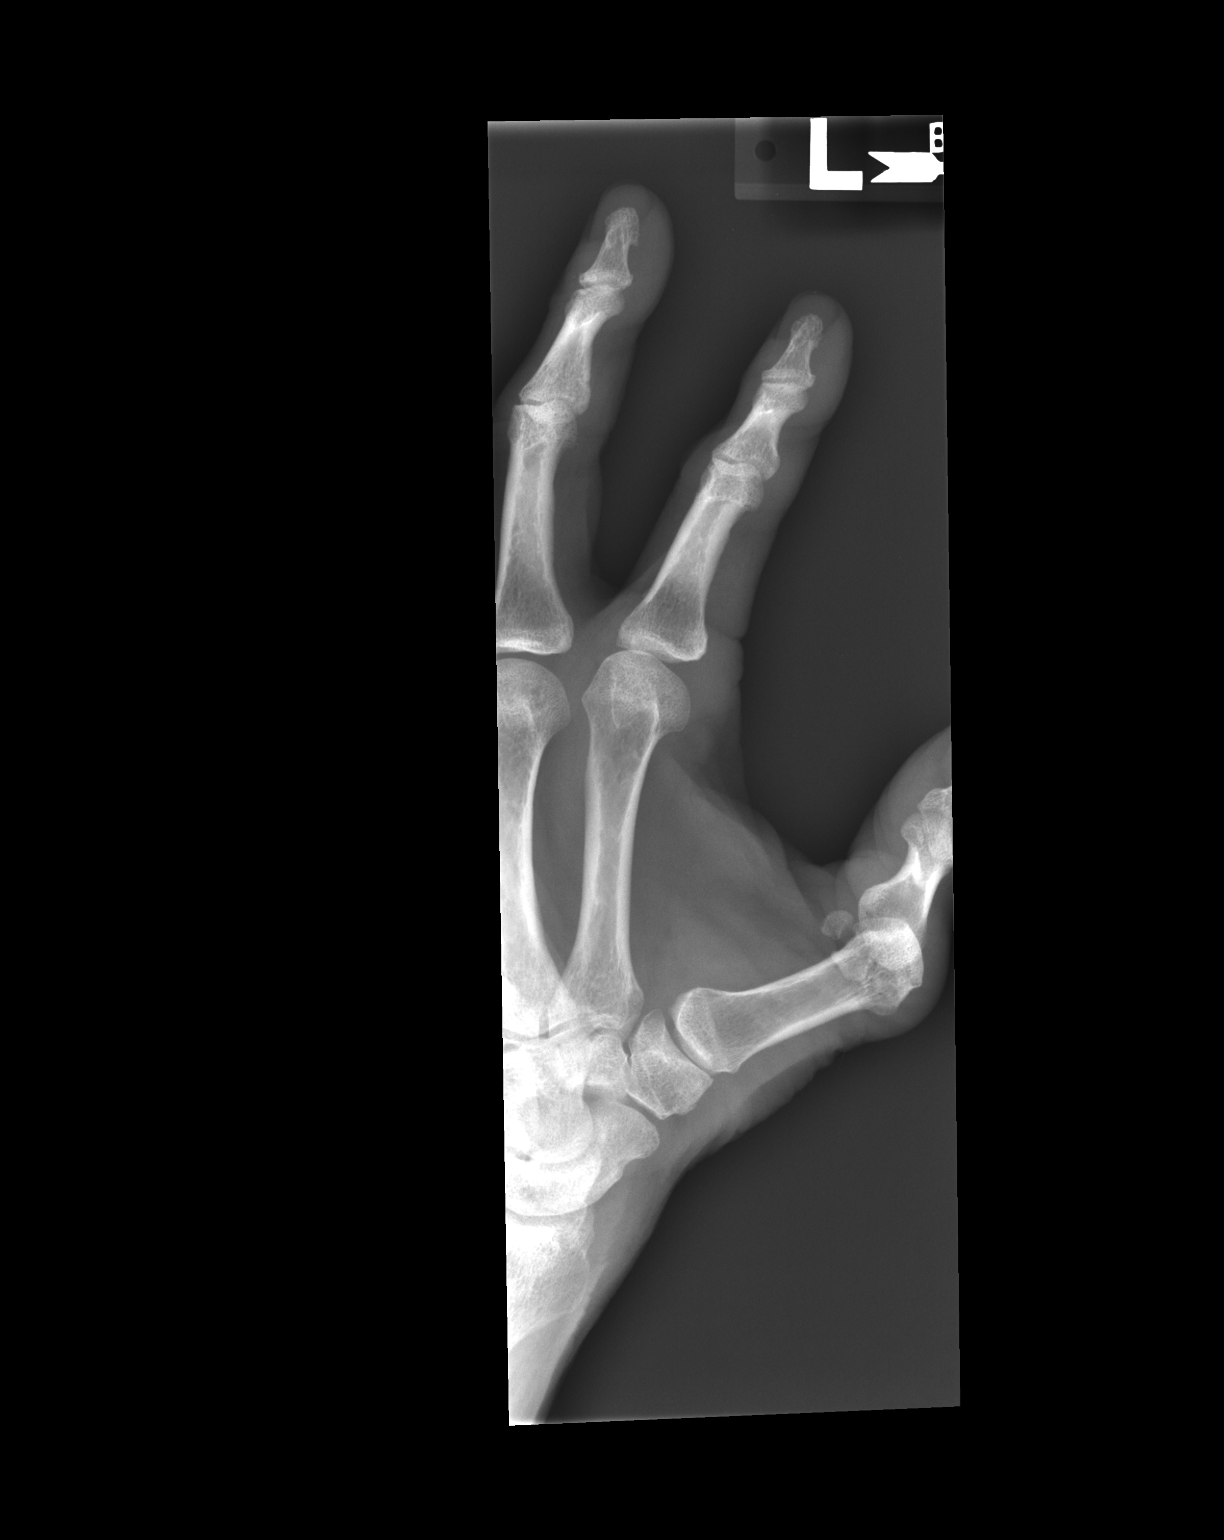

[lateral]
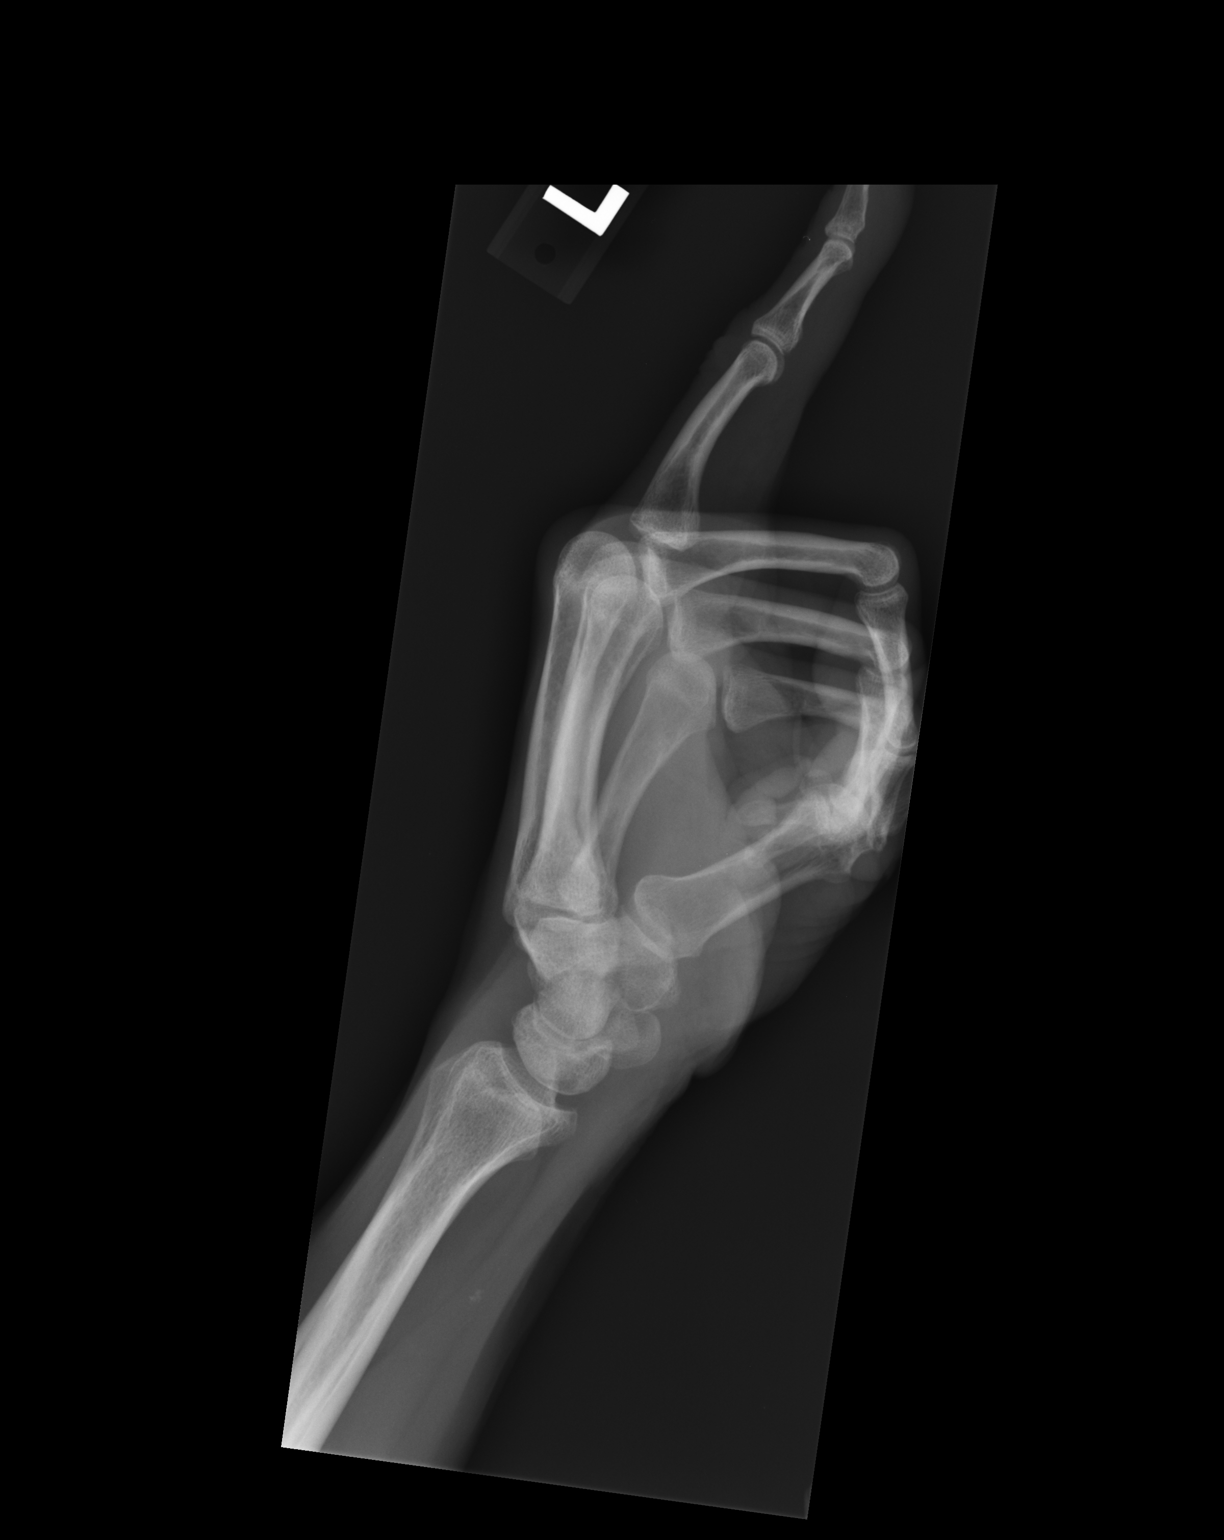

[3 of 3 positions shown; findings below may reference images not displayed]

FINDINGS: Two views of the left second finger submitted. No acute fracture or
subluxation. No periosteal reaction or bony erosion.
IMPRESSION: Negative.

## 2019-07-13 ENCOUNTER — Ambulatory Visit: Payer: Self-pay

## 2019-07-13 ENCOUNTER — Ambulatory Visit: Payer: Self-pay | Attending: Internal Medicine

## 2019-07-13 DIAGNOSIS — Z23 Encounter for immunization: Secondary | ICD-10-CM | POA: Insufficient documentation

## 2019-07-13 NOTE — Progress Notes (Signed)
   Covid-19 Vaccination Clinic  Name:  Lee Lyons    MRN: [id number] DOB: [birth year]  07/13/2019  [name] was observed post Covid-19 immunization for 15 minutes without incidence. He was provided with Vaccine Information Sheet and instruction to access the V-Safe system.   [name] was instructed to call 911 with any severe reactions post vaccine: Marland Kitchen Difficulty breathing  . Swelling of your face and throat  . A fast heartbeat  . A bad rash all over your body  . Dizziness and weakness    Immunizations Administered    Name Date Dose VIS Date Route   Pfizer COVID-19 Vaccine 07/13/2019  3:17 PM 0.3 mL 04/27/2019 Intramuscular   Manufacturer: [employer], [employer]   Lot: [id number]   NDC: [address]-3

## 2019-08-08 ENCOUNTER — Ambulatory Visit: Payer: Self-pay | Attending: Internal Medicine

## 2019-08-08 DIAGNOSIS — Z23 Encounter for immunization: Secondary | ICD-10-CM

## 2019-08-08 NOTE — Progress Notes (Signed)
   Covid-19 Vaccination Clinic  Name:  Lee Lyons    MRN: [id number] DOB: 22-Aug-1935  08/08/2019  [name] was observed post Covid-19 immunization for 15 minutes without incident. He was provided with Vaccine Information Sheet and instruction to access the V-Safe system.   [name] was instructed to call 911 with any severe reactions post vaccine: Marland Kitchen Difficulty breathing  . Swelling of face and throat  . A fast heartbeat  . A bad rash all over body  . Dizziness and weakness   Immunizations Administered    Name Date Dose VIS Date Route   Pfizer COVID-19 Vaccine 08/08/2019 12:33 PM 0.3 mL 04/27/2019 Intramuscular   Manufacturer: [employer], [employer]   Lot: [id number]   NDC: [address]-2

## 2022-08-13 DIAGNOSIS — R079 Chest pain, unspecified: Secondary | ICD-10-CM | POA: Diagnosis not present

## 2022-08-13 DIAGNOSIS — I129 Hypertensive chronic kidney disease with stage 1 through stage 4 chronic kidney disease, or unspecified chronic kidney disease: Secondary | ICD-10-CM | POA: Diagnosis not present

## 2022-08-13 DIAGNOSIS — Z953 Presence of xenogenic heart valve: Secondary | ICD-10-CM | POA: Diagnosis not present

## 2022-08-13 DIAGNOSIS — Z79899 Other long term (current) drug therapy: Secondary | ICD-10-CM | POA: Diagnosis not present

## 2022-08-13 DIAGNOSIS — R072 Precordial pain: Secondary | ICD-10-CM | POA: Diagnosis not present

## 2022-08-13 DIAGNOSIS — N400 Enlarged prostate without lower urinary tract symptoms: Secondary | ICD-10-CM | POA: Diagnosis not present

## 2022-08-13 DIAGNOSIS — E039 Hypothyroidism, unspecified: Secondary | ICD-10-CM | POA: Diagnosis not present

## 2022-08-13 DIAGNOSIS — Z952 Presence of prosthetic heart valve: Secondary | ICD-10-CM | POA: Diagnosis not present

## 2022-08-13 DIAGNOSIS — E785 Hyperlipidemia, unspecified: Secondary | ICD-10-CM | POA: Diagnosis not present

## 2022-08-13 DIAGNOSIS — J9811 Atelectasis: Secondary | ICD-10-CM | POA: Diagnosis not present

## 2022-08-13 DIAGNOSIS — Z7982 Long term (current) use of aspirin: Secondary | ICD-10-CM | POA: Diagnosis not present

## 2022-08-13 DIAGNOSIS — I082 Rheumatic disorders of both aortic and tricuspid valves: Secondary | ICD-10-CM | POA: Diagnosis not present

## 2022-08-13 DIAGNOSIS — N1831 Chronic kidney disease, stage 3a: Secondary | ICD-10-CM | POA: Diagnosis not present

## 2022-08-13 DIAGNOSIS — I771 Stricture of artery: Secondary | ICD-10-CM | POA: Diagnosis not present

## 2022-08-13 DIAGNOSIS — R918 Other nonspecific abnormal finding of lung field: Secondary | ICD-10-CM | POA: Diagnosis not present

## 2022-08-13 DIAGNOSIS — I2089 Other forms of angina pectoris: Secondary | ICD-10-CM | POA: Diagnosis not present

## 2022-08-14 DIAGNOSIS — R072 Precordial pain: Secondary | ICD-10-CM | POA: Diagnosis not present

## 2022-08-14 DIAGNOSIS — N1831 Chronic kidney disease, stage 3a: Secondary | ICD-10-CM | POA: Diagnosis not present

## 2022-08-14 DIAGNOSIS — E785 Hyperlipidemia, unspecified: Secondary | ICD-10-CM | POA: Diagnosis not present

## 2022-08-14 DIAGNOSIS — N183 Chronic kidney disease, stage 3 unspecified: Secondary | ICD-10-CM | POA: Diagnosis not present

## 2022-08-14 DIAGNOSIS — I10 Essential (primary) hypertension: Secondary | ICD-10-CM | POA: Diagnosis not present

## 2022-08-31 DIAGNOSIS — Z1330 Encounter for screening examination for mental health and behavioral disorders, unspecified: Secondary | ICD-10-CM | POA: Diagnosis not present

## 2022-08-31 DIAGNOSIS — I10 Essential (primary) hypertension: Secondary | ICD-10-CM | POA: Diagnosis not present

## 2022-08-31 DIAGNOSIS — E785 Hyperlipidemia, unspecified: Secondary | ICD-10-CM | POA: Diagnosis not present

## 2022-08-31 DIAGNOSIS — R9431 Abnormal electrocardiogram [ECG] [EKG]: Secondary | ICD-10-CM | POA: Diagnosis not present

## 2022-08-31 DIAGNOSIS — N1831 Chronic kidney disease, stage 3a: Secondary | ICD-10-CM | POA: Diagnosis not present

## 2022-08-31 DIAGNOSIS — R072 Precordial pain: Secondary | ICD-10-CM | POA: Diagnosis not present

## 2022-09-02 DIAGNOSIS — I130 Hypertensive heart and chronic kidney disease with heart failure and stage 1 through stage 4 chronic kidney disease, or unspecified chronic kidney disease: Secondary | ICD-10-CM | POA: Diagnosis not present

## 2022-09-02 DIAGNOSIS — I213 ST elevation (STEMI) myocardial infarction of unspecified site: Secondary | ICD-10-CM | POA: Diagnosis not present

## 2022-09-02 DIAGNOSIS — I4430 Unspecified atrioventricular block: Secondary | ICD-10-CM | POA: Diagnosis not present

## 2022-09-02 DIAGNOSIS — J9601 Acute respiratory failure with hypoxia: Secondary | ICD-10-CM | POA: Diagnosis not present

## 2022-09-02 DIAGNOSIS — I110 Hypertensive heart disease with heart failure: Secondary | ICD-10-CM | POA: Diagnosis not present

## 2022-09-02 DIAGNOSIS — I509 Heart failure, unspecified: Secondary | ICD-10-CM | POA: Diagnosis not present

## 2022-09-02 DIAGNOSIS — I5189 Other ill-defined heart diseases: Secondary | ICD-10-CM | POA: Diagnosis not present

## 2022-09-02 DIAGNOSIS — I440 Atrioventricular block, first degree: Secondary | ICD-10-CM | POA: Diagnosis not present

## 2022-09-02 DIAGNOSIS — R0602 Shortness of breath: Secondary | ICD-10-CM | POA: Diagnosis not present

## 2022-09-02 DIAGNOSIS — J90 Pleural effusion, not elsewhere classified: Secondary | ICD-10-CM | POA: Diagnosis not present

## 2022-09-02 DIAGNOSIS — I2102 ST elevation (STEMI) myocardial infarction involving left anterior descending coronary artery: Secondary | ICD-10-CM | POA: Diagnosis not present

## 2022-09-02 DIAGNOSIS — Z66 Do not resuscitate: Secondary | ICD-10-CM | POA: Diagnosis not present

## 2022-09-02 DIAGNOSIS — R079 Chest pain, unspecified: Secondary | ICD-10-CM | POA: Diagnosis not present

## 2022-09-02 DIAGNOSIS — I5021 Acute systolic (congestive) heart failure: Secondary | ICD-10-CM | POA: Diagnosis not present

## 2022-09-02 DIAGNOSIS — I25119 Atherosclerotic heart disease of native coronary artery with unspecified angina pectoris: Secondary | ICD-10-CM | POA: Diagnosis not present

## 2022-09-02 DIAGNOSIS — T82857A Stenosis of cardiac prosthetic devices, implants and grafts, initial encounter: Secondary | ICD-10-CM | POA: Diagnosis not present

## 2022-09-02 DIAGNOSIS — I517 Cardiomegaly: Secondary | ICD-10-CM | POA: Diagnosis not present

## 2022-09-02 DIAGNOSIS — E872 Acidosis, unspecified: Secondary | ICD-10-CM | POA: Diagnosis not present

## 2022-09-02 DIAGNOSIS — I5043 Acute on chronic combined systolic (congestive) and diastolic (congestive) heart failure: Secondary | ICD-10-CM | POA: Diagnosis not present

## 2022-09-02 DIAGNOSIS — R0989 Other specified symptoms and signs involving the circulatory and respiratory systems: Secondary | ICD-10-CM | POA: Diagnosis not present

## 2022-09-02 DIAGNOSIS — R5383 Other fatigue: Secondary | ICD-10-CM | POA: Diagnosis not present

## 2022-09-02 DIAGNOSIS — R570 Cardiogenic shock: Secondary | ICD-10-CM | POA: Diagnosis not present

## 2022-09-02 DIAGNOSIS — I249 Acute ischemic heart disease, unspecified: Secondary | ICD-10-CM | POA: Diagnosis not present

## 2022-09-02 DIAGNOSIS — R0789 Other chest pain: Secondary | ICD-10-CM | POA: Diagnosis not present

## 2022-09-02 DIAGNOSIS — Z20822 Contact with and (suspected) exposure to covid-19: Secondary | ICD-10-CM | POA: Diagnosis not present

## 2022-09-02 DIAGNOSIS — Z1152 Encounter for screening for COVID-19: Secondary | ICD-10-CM | POA: Diagnosis not present

## 2022-09-02 DIAGNOSIS — N179 Acute kidney failure, unspecified: Secondary | ICD-10-CM | POA: Diagnosis not present

## 2022-09-15 DEATH — deceased
